# Patient Record
Sex: Male | Born: 1995 | Race: Black or African American | Hispanic: No | Marital: Single | Smoking: Never smoker
Health system: Southern US, Community
[De-identification: ages and names within clinical notes are randomized; demographics above are authoritative.]

## PROBLEM LIST (undated history)

## (undated) HISTORY — PX: KNEE SURGERY: SHX244

---
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
## 2021-08-02 ENCOUNTER — Emergency Department (HOSPITAL_COMMUNITY)
Admission: EM | Admit: 2021-08-02 | Discharge: 2021-08-02 | Disposition: A | Discharge status: Home / Self Care | Payer: Self-pay | Attending: Emergency Medicine | Admitting: Emergency Medicine

## 2021-08-02 ENCOUNTER — Other Ambulatory Visit: Payer: Self-pay

## 2021-08-02 DIAGNOSIS — Z20822 Contact with and (suspected) exposure to covid-19: Secondary | ICD-10-CM | POA: Insufficient documentation

## 2021-08-02 DIAGNOSIS — J020 Streptococcal pharyngitis: Secondary | ICD-10-CM | POA: Insufficient documentation

## 2021-08-02 LAB — RESP PANEL BY RT-PCR (FLU A&B, COVID) ARPGX2
Influenza A by PCR: NEGATIVE
Influenza B by PCR: NEGATIVE
SARS Coronavirus 2 by RT PCR: NEGATIVE

## 2021-08-02 LAB — GROUP A STREP BY PCR: Group A Strep by PCR: DETECTED — AB

## 2021-08-02 MED ORDER — CEPHALEXIN 500 MG PO CAPS: 500.0000 mg | ORAL_CAPSULE | Freq: Two times a day (BID) | ORAL | 0 refills | Status: DC

## 2021-08-02 MED ORDER — LIDOCAINE VISCOUS HCL 2 % MT SOLN: 5.0000 mL | Freq: Three times a day (TID) | OROMUCOSAL | 0 refills | Status: DC | PRN

## 2021-08-02 NOTE — Discharge Instructions (Signed)
Your test today indicate that you have strep throat.  It is important that you take the antibiotic as directed until its finished.  Use the mouthwash to help with throat pain.  Do not swallow.  You may also take Tylenol and/or ibuprofen if needed for pain.  Frequent small sips of fluids.  Follow-up with your primary care provider for recheck return to the emergency department for any new or worsening symptoms.

## 2021-08-02 NOTE — ED Triage Notes (Signed)
Pt states he has had throat pain on and off for the last week. Says it feels like something is blocking his throat and he cannot swallow. Difficulty breathing when sleeping. Taken cough drops, Nyquil cold and flu. Denies any other cold/flu symptoms.

## 2021-08-02 NOTE — ED Provider Notes (Signed)
[facility] EMERGENCY DEPARTMENT Provider Note   CSN: [id number] Arrival date & time: 08/02/21  1044     History Chief Complaint  Patient presents with   Sore Throat    Chad Watson is a 25 y.o. male.   Sore Throat Pertinent negatives include no chest pain, no abdominal pain, no headaches and no shortness of breath.      Chad Watson is a 25 y.o. male who presents to the Emergency Department complaining of sore throat and cough for nearly 1 week.  States that he feels significant pain with swallowing and feels as though something is "stuck in my throat."  Occasional cough, denies shortness of breath and wheezing.  Tolerating only's very small sips of liquids and his own saliva.  Unable to eat solid food due to pain.  No known sick contacts, fever or chills.  He has been taking over-the-counter cold and flu medication and cough drops without relief.  No past medical history on file.  There are no problems to display for this patient.   Past Surgical History:  Procedure Laterality Date   KNEE SURGERY         No family history on file.  Social History   Tobacco Use   Smoking status: Never  Substance Use Topics   Alcohol use: No    Home Medications Prior to Admission medications   Medication Sig Start Date End Date Taking? Authorizing Provider  azithromycin (ZITHROMAX Z-PAK) 250 MG tablet Two tablets first day then one a day 09/16/13   Elson Areas, PA-C    Allergies    Amoxicillin  Review of Systems   Review of Systems  Constitutional:  Negative for chills, fatigue and fever.  HENT:  Positive for sore throat and trouble swallowing. Negative for voice change.   Respiratory:  Positive for cough. Negative for chest tightness, shortness of breath and wheezing.   Cardiovascular:  Negative for chest pain.  Gastrointestinal:  Negative for abdominal pain, diarrhea, nausea and vomiting.  Genitourinary:  Negative for dysuria.  Musculoskeletal:  Negative for  arthralgias, myalgias, neck pain and neck stiffness.  Skin:  Negative for rash.  Neurological:  Negative for dizziness, weakness, numbness and headaches.  All other systems reviewed and are negative.  Physical Exam Updated Vital Signs BP (!) 136/93 (BP Location: Right Arm)   Pulse 82   Temp 98.6 F (37 C) (Oral)   Resp 14   Ht 5\' 5"  (1.651 m)   Wt 77.1 kg   SpO2 99%   BMI 28.29 kg/m   Physical Exam Vitals and nursing note reviewed.  Constitutional:      Appearance: Normal appearance. He is not ill-appearing or toxic-appearing.  HENT:     Head: Normocephalic.     Right Ear: Tympanic membrane and ear canal normal.     Left Ear: Tympanic membrane and ear canal normal.     Nose: No rhinorrhea.     Mouth/Throat:     Mouth: Mucous membranes are moist.     Pharynx: Uvula midline. Posterior oropharyngeal erythema present. No oropharyngeal exudate.     Comments: Significant erythema of the oropharynx.  No edema or exudate.  Uvula midline nonedematous.  No trismus or edema of the soft palate.  He is handling his own secretions well. Eyes:     Pupils: Pupils are equal, round, and reactive to light.  Neck:     Thyroid: No thyromegaly.     Trachea: Trachea and phonation normal.  Cardiovascular:  Rate and Rhythm: Normal rate and regular rhythm.     Pulses: Normal pulses.  Pulmonary:     Effort: Pulmonary effort is normal.     Breath sounds: Normal breath sounds. No wheezing.  Abdominal:     Palpations: Abdomen is soft.     Tenderness: There is no abdominal tenderness.  Musculoskeletal:        General: Normal range of motion.     Cervical back: Normal range of motion and neck supple.  Lymphadenopathy:     Cervical: Cervical adenopathy present.     Right cervical: No superficial cervical adenopathy.    Left cervical: Superficial cervical adenopathy present.  Skin:    General: Skin is warm.     Findings: No rash.  Neurological:     Mental Status: He is alert.     Sensory:  No sensory deficit.     Motor: No weakness.    ED Results / Procedures / Treatments   Labs (all labs ordered are listed, but only abnormal results are displayed) Labs Reviewed  GROUP A STREP BY PCR - Abnormal; Notable for the following components:      Result Value   Group A Strep by PCR DETECTED (*)    All other components within normal limits  RESP PANEL BY RT-PCR (FLU A&B, COVID) [id number]    EKG None  Radiology No results found.  Procedures Procedures   Medications Ordered in ED Medications - No data to display  ED Course  I have reviewed the triage vital signs and the nursing notes.  Pertinent labs & imaging results that were available during my care of the patient were reviewed by me and considered in my medical decision making (see chart for details).    MDM Rules/Calculators/A&P                           Patient here with symptoms concerning for strep pharyngitis.  No evidence suggestive of peritonsillar or retropharyngeal abscess.   On recheck, patient resting comfortably.  Continues to handle secretions well.  Strep PCR positive.  COVID and influenza testing negative.  Patient notes history of amoxicillin, will treat with cephalexin.  He appears appropriate for discharge home, agreeable to treatment plan and close outpatient follow-up.  Return precautions were discussed.  Final Clinical Impression(s) / ED Diagnoses Final diagnoses:  Strep pharyngitis    Rx / DC Orders ED Discharge Orders     None        Pauline Aus, PA-C 08/02/21 1324    Benjiman Core, MD 08/02/21 1851

## 2022-06-10 ENCOUNTER — Other Ambulatory Visit: Payer: Self-pay

## 2022-06-10 ENCOUNTER — Emergency Department: Payer: Self-pay

## 2022-06-10 ENCOUNTER — Emergency Department
Admission: EM | Admit: 2022-06-10 | Discharge: 2022-06-10 | Disposition: A | Discharge status: Home / Self Care | Payer: Self-pay | Attending: Emergency Medicine | Admitting: Emergency Medicine

## 2022-06-10 DIAGNOSIS — S62339A Displaced fracture of neck of unspecified metacarpal bone, initial encounter for closed fracture: Secondary | ICD-10-CM

## 2022-06-10 DIAGNOSIS — S62316A Displaced fracture of base of fifth metacarpal bone, right hand, initial encounter for closed fracture: Secondary | ICD-10-CM | POA: Insufficient documentation

## 2022-06-10 DIAGNOSIS — W228XXA Striking against or struck by other objects, initial encounter: Secondary | ICD-10-CM | POA: Insufficient documentation

## 2022-06-10 DIAGNOSIS — M7989 Other specified soft tissue disorders: Secondary | ICD-10-CM | POA: Insufficient documentation

## 2022-06-10 NOTE — Discharge Instructions (Addendum)
Keep the splint clean and dry. Apply ice packs over the splint to reduce swelling. Follow-up with Ortho for further fracture care.

## 2022-06-10 NOTE — ED Notes (Signed)
Patient discharged to home per MD order. Patient in stable condition, and deemed medically cleared by ED provider for discharge. Discharge instructions reviewed with patient/family using "Teach Back"; verbalized understanding of medication education and administration, and information about follow-up care. Denies further concerns. ° °

## 2022-06-10 NOTE — ED Provider Notes (Signed)
[facility] Emergency Department Provider Note     Event Date/Time   First MD Initiated Contact with Patient 06/10/22 1444     (approximate)   History   Hand Pain   HPI  Chad Watson is a 26 y.o. right-handed male presents to the ED for evaluation of right hand pain.  He apparently punched a wall today, and anger, and presents for right hand pain and swelling.  No other injury reported at this time.   Physical Exam   Triage Vital Signs: ED Triage Vitals  Enc Vitals Group     BP 06/10/22 1439 (!) 138/95     Pulse Rate 06/10/22 1439 65     Resp 06/10/22 1439 16     Temp 06/10/22 1439 98.1 F (36.7 C)     Temp Source 06/10/22 1439 Oral     SpO2 06/10/22 1439 100 %     Weight 06/10/22 1440 170 lb (77.1 kg)     Height 06/10/22 1440 5\' 6"  (1.676 m)     Head Circumference --      Peak Flow --      Pain Score 06/10/22 1442 7     Pain Loc --      Pain Edu? --      Excl. in [place]? --     Most recent vital signs: Vitals:   06/10/22 1439  BP: (!) 138/95  Pulse: 65  Resp: 16  Temp: 98.1 F (36.7 C)  SpO2: 100%    General Awake, no distress.  CV:  Good peripheral perfusion.  RESP:  Normal effort.  ABD:  No distention.  MSK:  Right hand with dorsal STS and deformity to the 5th MC. Decreased composite fist due to pain.     ED Results / Procedures / Treatments   Labs (all labs ordered are listed, but only abnormal results are displayed) Labs Reviewed - No data to display   EKG    RADIOLOGY  I personally viewed and evaluated these images as part of my medical decision making, as well as reviewing the written report by the radiologist.  ED Provider Interpretation: comminuted 5th MC fracture  DG Hand Complete Right  Result Date: 06/10/2022 CLINICAL DATA:  Punched a wall today EXAM: RIGHT HAND - COMPLETE 3 VIEW COMPARISON:  05/28/2007 FINDINGS: Comminuted, mildly displaced fracture of the distal fifth metacarpal, with volar  angulation. No additional fracture is seen. The joint spaces are preserved. Soft tissue swelling over the ulnar aspect of the palm and fifth MCP joint IMPRESSION: Comminuted, mildly displaced fracture of the distal fifth metacarpal. Electronically Signed   By: Merilyn Baba M.D.   On: 06/10/2022 15:23     PROCEDURES:  Critical Care performed: No  .Splint Application  Date/Time: 06/10/2022 3:24 PM  Performed by: Melvenia Needles, PA-C Authorized by: Melvenia Needles, PA-C   Consent:    Consent obtained:  Verbal   Consent given by:  Patient   Risks, benefits, and alternatives were discussed: yes     Risks discussed:  Pain   Alternatives discussed:  Referral Universal protocol:    Test results available: yes     Imaging studies available: yes     Site/side marked: yes     Patient identity confirmed:  Verbally with patient Pre-procedure details:    Distal neurologic exam:  Normal   Distal perfusion: distal pulses strong   Procedure details:    Location:  Hand   Hand  location:  R hand   Splint type:  Ulnar gutter   Supplies:  Cotton padding, elastic bandage and fiberglass   Attestation: Splint applied and adjusted personally by me   Post-procedure details:    Distal neurologic exam:  Normal   Distal perfusion: distal pulses strong     Procedure completion:  Tolerated well, no immediate complications   Post-procedure imaging: not applicable      MEDICATIONS ORDERED IN ED: Medications - No data to display   IMPRESSION / MDM / ASSESSMENT AND PLAN / ED COURSE  I reviewed the triage vital signs and the nursing notes.                              Differential diagnosis includes, but is not limited to, hand contusion, hand fracture, finger dislocation  Patient's presentation is most consistent with acute complicated illness / injury requiring diagnostic workup.  Patient's diagnosis is consistent with right hand contusion and boxer's fracture. Patient will be  discharged home with fracture care instructions. Patient is to follow up with Ortho as needed or otherwise directed. Patient is given ED precautions to return to the ED for any worsening or new symptoms.     FINAL CLINICAL IMPRESSION(S) / ED DIAGNOSES   Final diagnoses:  Closed boxer's fracture, initial encounter     Rx / DC Orders   ED Discharge Orders     None        Note:  This document was prepared using Dragon voice recognition software and may include unintentional dictation errors.    Lissa Hoard, PA-C 06/10/22 1726    Jene Every, MD 06/15/22 [id number]

## 2022-06-10 NOTE — ED Triage Notes (Signed)
Pt states he punched a wall today and now has right hand pan and swelling.

## 2022-06-14 ENCOUNTER — Other Ambulatory Visit: Payer: Self-pay

## 2022-06-14 ENCOUNTER — Encounter: Payer: Self-pay | Admitting: Intensive Care

## 2022-06-14 ENCOUNTER — Emergency Department
Admission: EM | Admit: 2022-06-14 | Discharge: 2022-06-14 | Disposition: A | Discharge status: Home / Self Care | Payer: Self-pay | Attending: Emergency Medicine | Admitting: Emergency Medicine

## 2022-06-14 DIAGNOSIS — S62339D Displaced fracture of neck of unspecified metacarpal bone, subsequent encounter for fracture with routine healing: Secondary | ICD-10-CM

## 2022-06-14 DIAGNOSIS — S62306D Unspecified fracture of fifth metacarpal bone, right hand, subsequent encounter for fracture with routine healing: Secondary | ICD-10-CM | POA: Insufficient documentation

## 2022-06-14 DIAGNOSIS — W1839XD Other fall on same level, subsequent encounter: Secondary | ICD-10-CM | POA: Insufficient documentation

## 2022-06-14 NOTE — ED Triage Notes (Signed)
Patient seen on 06/10/22 and had splint placed on right hand. Patient reports splint has fallen off and needs redoing.

## 2022-06-14 NOTE — ED Provider Notes (Signed)
   [facility] Provider Note    Event Date/Time   First MD Initiated Contact with Patient 06/14/22 1024     (approximate)  History   Chief Complaint: Hand Pain  HPI  Chad Watson is a 26 y.o. male with no significant past medical history who presents to the emergency department for a new splint.  According to the patient on 06/10/2022 patient was seen in the emergency department after an injury diagnosed with a boxer's fracture/fifth metacarpal fracture.  Patient appears to have been placed in ulnar gutter splint, he states this plan excellently fell off so he came to the emergency department for a new splint.  Patient states he is planning to call orthopedics tomorrow to arrange a follow-up appointment.  Physical Exam   Triage Vital Signs: ED Triage Vitals  Enc Vitals Group     BP 06/14/22 1012 (!) 133/97     Pulse Rate 06/14/22 1012 70     Resp 06/14/22 1012 18     Temp 06/14/22 1012 97.7 F (36.5 C)     Temp Source 06/14/22 1012 Oral     SpO2 06/14/22 1012 96 %     Weight 06/14/22 1012 170 lb (77.1 kg)     Height 06/14/22 1012 5\' 6"  (1.676 m)     Head Circumference --      Peak Flow --      Pain Score 06/14/22 1021 6     Pain Loc --      Pain Edu? --      Excl. in [place]? --     Most recent vital signs: Vitals:   06/14/22 1012  BP: (!) 133/97  Pulse: 70  Resp: 18  Temp: 97.7 F (36.5 C)  SpO2: 96%    General: Awake, no distress.  CV:  Good peripheral perfusion.  Regular rate and rhythm  Resp:  Normal effort.  Equal breath sounds bilaterally.  Abd:  No distention.  Soft, nontender.  No rebound or guarding. Other:  Moderate swelling of the dorsal aspect of the right hand overlying the mid fifth metacarpal.  Neurovascular intact distally with good cap refill sensation intact although he states he feels somewhat diminished compared to normal.  This could be result of the swelling.   ED Results / Procedures / Treatments   MEDICATIONS  ORDERED IN ED: Medications - No data to display   IMPRESSION / MDM / [place] / ED COURSE  I reviewed the triage vital signs and the nursing notes.  Patient's presentation is most consistent with acute, uncomplicated illness.  Patient presents emergency department after his right ulnar gutter splint fell off and became unusable.  Patient is neuro vastly intact, patient plans to follow-up with orthopedics by calling for an appointment tomorrow.  We will replace the ulnar gutter splint and have the patient follow-up with orthopedics.  Patient agreeable to plan of care.  FINAL CLINICAL IMPRESSION(S) / ED DIAGNOSES   Metacarpal fracture   Note:  This document was prepared using Dragon voice recognition software and may include unintentional dictation errors.   Harvest Dark, MD 06/14/22 1028
